# Patient Record
Sex: Female | Born: 1980 | Race: Black or African American | Hispanic: No | Marital: Single | Smoking: Never smoker
Health system: Southern US, Community
[De-identification: ages and names within clinical notes are randomized; demographics above are authoritative.]

## PROBLEM LIST (undated history)

## (undated) ENCOUNTER — Observation Stay: Admission: AD | Payer: Medicaid Other | Source: Ambulatory Visit | Admitting: Obstetrics & Gynecology

## (undated) DIAGNOSIS — E119 Type 2 diabetes mellitus without complications: Secondary | ICD-10-CM

## (undated) DIAGNOSIS — I10 Essential (primary) hypertension: Secondary | ICD-10-CM

## (undated) DIAGNOSIS — O139 Gestational [pregnancy-induced] hypertension without significant proteinuria, unspecified trimester: Secondary | ICD-10-CM

---

## 2004-09-27 ENCOUNTER — Emergency Department: Admit: 2004-09-27 | Payer: Self-pay | Source: Emergency Department | Admitting: Emergency Medicine

## 2016-12-20 ENCOUNTER — Emergency Department: Payer: Self-pay

## 2016-12-20 ENCOUNTER — Emergency Department: Payer: Worker's Comp, Other unspecified

## 2016-12-20 ENCOUNTER — Emergency Department
Admission: EM | Admit: 2016-12-20 | Discharge: 2016-12-20 | Disposition: A | Discharge status: Home / Self Care | Payer: Worker's Comp, Other unspecified | Attending: Emergency Medicine | Admitting: Emergency Medicine

## 2016-12-20 DIAGNOSIS — M25562 Pain in left knee: Secondary | ICD-10-CM | POA: Insufficient documentation

## 2016-12-20 DIAGNOSIS — Y92239 Unspecified place in hospital as the place of occurrence of the external cause: Secondary | ICD-10-CM | POA: Insufficient documentation

## 2016-12-20 DIAGNOSIS — X58XXXA Exposure to other specified factors, initial encounter: Secondary | ICD-10-CM | POA: Insufficient documentation

## 2016-12-20 DIAGNOSIS — Y990 Civilian activity done for income or pay: Secondary | ICD-10-CM | POA: Insufficient documentation

## 2016-12-20 NOTE — ED Provider Notes (Signed)
Physician/Midlevel provider first contact with patient: 12/20/16 1754         History     Chief Complaint   Patient presents with   . Back Pain   . Knee Pain   . 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 Patient was ambulatory after the event, but has ongoing knee and back pain, and is here for evaluation. Patient denies headache, dizziness, weakness, numbness/tingling, nausea/vomiting, neck pain, abdominal pain, shortness of breath.                 History reviewed. No pertinent past medical history.    History reviewed. No pertinent surgical history.    History reviewed. No pertinent family history.    Social  Social History   Substance Use Topics   . Smoking status: Never Smoker   . Smokeless tobacco: Never Used   . Alcohol use Yes      Comment: Socially       .     No Known Allergies    Home Medications     No Medications           Review of Systems   Constitutional: Negative.    HENT: Negative.    Eyes: Negative.    Respiratory: Negative.    Cardiovascular: Negative.    Gastrointestinal: Negative.    Genitourinary: Negative.    Musculoskeletal: Negative for joint swelling.        Left knee pain with movement   Skin: Negative.    Neurological: Negative.    Hematological: Negative.    Psychiatric/Behavioral: Negative.        Physical Exam    BP: 142/87, Heart Rate: 81, Temp: 98 F (36.7 C), Resp Rate: 18, SpO2: 100 %, Weight: 86.2 kg    Physical Exam   Constitutional: She is oriented to person, place, and time. She appears well-developed and well-nourished. No distress.   HENT:   Head: Normocephalic and atraumatic.   Right Ear: External ear normal.   Left Ear: External ear normal.   Nose: Nose normal.   Eyes: Conjunctivae and EOM are normal. Pupils are equal, round, and reactive to light. Right eye exhibits no discharge. Left eye exhibits no discharge. No scleral  icterus.   Neck: Normal range of motion. Neck supple. No JVD present. No tracheal deviation present.   Cardiovascular: Normal rate, regular rhythm, normal heart sounds and intact distal pulses.  Exam reveals no gallop and no friction rub.    No murmur heard.  Pulmonary/Chest: Effort normal and breath sounds normal. No stridor. No respiratory distress. She has no wheezes. She has no rales. She exhibits no tenderness.   Abdominal: Soft. Bowel sounds are normal. She exhibits no distension. There is no tenderness. There is no rebound and no guarding.   Musculoskeletal: She exhibits tenderness.   Left knee, tender to palpation, pain with movement   Neurological: She is alert and oriented to person, place, and time. She displays normal reflexes. No cranial nerve deficit or sensory deficit. She exhibits normal muscle tone. Coordination normal.   Skin: Skin is warm and dry. Capillary refill takes less than 2 seconds. No rash noted. She is not diaphoretic. No erythema. No pallor.   Psychiatric: She has a normal mood and affect. Her behavior is normal. Judgment and thought content  normal.   Nursing note and vitals reviewed.        MDM and ED Course     ED Medication Orders     None             MDM  Number of Diagnoses or Management Options  Diagnosis management comments: Left knee pain       Amount and/or Complexity of Data Reviewed  Tests in the radiology section of CPT: ordered  Discuss the patient with other providers: yes           Knee 4+ Views Left    Result Date: 12/20/2016  There is no evidence of acute injury. Stephannie Peters, MD 12/20/2016 7:06 PM    Discharge with ace wrap, ice and elevation, tylenol or ibuprofen as needed; follow up with Orthopedics; retun to work 3 days     Procedures    Clinical Impression & Disposition     Clinical Impression  Final diagnoses:   Acute pain of left knee        ED Disposition     ED Disposition Condition Date/Time Comment    Discharge  Tue Dec 20, 2016  7:23 PM Arna Snipe  discharge to home/self care.    Condition at disposition: Stable           New Prescriptions    No medications on file        Treatment Team: Scribe: Tresa Endo, [place]  12/20/16 1925

## 2016-12-20 NOTE — Discharge Instructions (Signed)
Dear Ms. Kristina Avila:    Thank you for choosing the [facility] Emergency Department, the premier emergency department in the [place] area.  I hope your visit today was EXCELLENT.    Specific instructions for your visit today:    Tylenol or ibuprofen as needed  Orthopedic follow up with [facility]    If you do not continue to improve or your condition worsens, please contact your doctor or return immediately to the Emergency Department.    Sincerely,  Mikel Cella*  Attending Emergency Physician  [facility] Emergency Department    ONSITE PHARMACY  Our full service onsite pharmacy is located in the ER waiting room.  Open 7 days a week from 9 am to 11 pm.  We accept all major insurances and prices are competitive with major retailers.  Ask your provider to print your prescriptions down to the pharmacy to speed you on your way home.    OBTAINING A PRIMARY CARE APPOINTMENT    Primary care physicians (PCPs, also known as primary care doctors) are either internists or family medicine doctors. Both types of PCPs focus on health promotion, disease prevention, patient education and counseling, and treatment of acute and chronic medical conditions.    Call for an appointment with a primary care doctor.  Ask to see who is taking new patients.      Medical Group  telephone:  [phone]  [email or web address]    DOCTOR REFERRALS  Call [phone] (available 24 hours a day, 7 days a week) if you need any further referrals and we can help you find a primary care doctor or specialist.  Also, available online at:  [email or web address]    YOUR CONTACT INFORMATION  Before leaving please check with registration to make sure we have an up-to-date contact number.  You can call registration at [phone] to update your information.  For questions about your hospital bill, please call [phone].  For questions about your Emergency Dept Physician bill please call  [phone].      FREE HEALTH SERVICES  If you need help with health or social services, please call 2-1-1 for a free referral to resources in your area.  2-1-1 is a free service connecting people with information on health insurance, free clinics, pregnancy, mental health, dental care, food assistance, housing, and substance abuse counseling.  Also, available online at:  http://www.[place].org    MEDICAL RECORDS AND TESTS  Certain laboratory test results do not come back the same day, for example urine cultures.   We will contact you if other important findings are noted.  Radiology films are often reviewed again to ensure accuracy.  If there is any discrepancy, we will notify you.      Please call [phone] to pick up a complimentary CD of any radiology studies performed.  If you or your doctor would like to request a copy of your medical records, please call [phone].      ORTHOPEDIC INJURY   Please know that significant injuries can exist even when an initial x-ray is read as normal or negative.  This can occur because some fractures (broken bones) are not initially visible on x-rays.  For this reason, close outpatient follow-up with your primary care doctor or bone specialist (orthopedist) is required.    MEDICATIONS AND FOLLOWUP  Please be aware that some prescription medications can cause drowsiness.  Use caution when driving or operating machinery.    The examination and treatment you have received in our Emergency Department  is provided on an emergency basis, and is not intended to be a substitute for your primary care physician.  It is important that your doctor checks you again and that you report any new or remaining problems at that time.      [place]  The nearest 24 hour pharmacy is:    [facility] [address] [place], [place] [address]  [place]  [facility])  Call to start or finish an application,  compare plans, enroll or ask a question.  [place]: [phone]  Web:  Healthcare.gov    Help Enrolling in [place]  267-521-0446 (TOLL-FREE)  (443)536-8139 (TTY)  Web:  Http://www.coverva.org    Local Help Enrolling in the [place]  825-553-2961 (MAIN)  Email:  health-help@nvfs$ .org  Web:  http://lewis-perez.info/  Address:  [id number] [place], [place] [address]    SEDATING MEDICATIONS  Sedating medications include strong pain medications (e.g. narcotics), muscle relaxers, benzodiazepines (used for anxiety and as muscle relaxers), Benadryl/diphenhydramine and other antihistamines for allergic reactions/itching, and other medications.  If you are unsure if you have received a sedating medication, please ask your physician or nurse.  If you received a sedating medication: DO NOT drive a car. DO NOT operate machinery. DO NOT perform jobs where you need to be alert.  DO NOT drink alcoholic beverages while taking this medicine.     If you get dizzy, sit or lie down at the first signs. Be careful going up and down stairs.  Be extra careful to prevent falls.     Never give this medicine to others.     Keep this medicine out of reach of children.     Do not take or save old medicines. Throw them away when outdated.     Keep all medicines in a cool, dry place. DO NOT keep them in your bathroom medicine cabinet or in a cabinet above the stove.    MEDICATION REFILLS  Please be aware that we cannot refill any prescriptions through the ER. If you need further treatment from what is provided at your ER visit, please follow up with your primary care doctor or your pain management specialist.    [place]  Did you know [facility] has two freestanding ERs located just a few miles away?  [facility] [place] [facility] [place] have short wait times, easy free parking directly in front of the  building and top patient satisfaction scores - and the same Board Certified Emergency Medicine doctors as [facility].

## 2016-12-20 NOTE — ED Provider Notes (Signed)
[facility] EMERGENCY DEPARTMENT H&P                                             ATTENDING SUPERVISORY NOTE       ATTENDING NOTE      Seen and agree with plan as documented by PA.    I spoke to and examined the patient as well: Yes  I was present during key portions of any procedures performed: N/A            VISIT INFORMATION        Clinical Course in the ED:             Medications Given in the ED:    .     ED Medication Orders     None            Procedures:            Interpretations:      O2 sat-           saturation: 100 %; Oxygen use: room air; Interpretation: Normal              PAST HISTORY        Primary Care Provider: No primary care provider on file.        PMH/PSH:    .     History reviewed. No pertinent past medical history.    She has no past surgical history on file.      Social/Family History:      She reports that she has never smoked. She has never used smokeless tobacco. She reports that she drinks alcohol. She reports that she does not use drugs.    History reviewed. No pertinent family history.      Listed Medications on Arrival:    .     Home Medications     No Medications         Allergies: She has No Known Allergies.            RESULTS        Lab Results:      Results     ** No results found for the last 24 hours. **              Radiology Results:      No orders to display               Attending Attestation:      The patient was seen and examined by the mid-level (physician's assistant or nurse practitioner), or fellow, and the plan of care was discussed with me. I agree with the plan as it was presented to me.  I have reviewed and agree with the final ED diagnosis.              Scribe Attestation:      I was acting as a Neurosurgeon for [employer]* on Haskel Schroeder    I am the first provider for this patient and I personally performed the services documented. Pablo Ledger is scribing for me on [facility] M. This note and  the patient instructions accurately reflect work and decisions made by me.  Clarisa Schools, MD  12/25/16 [phone]

## 2019-09-12 ENCOUNTER — Emergency Department
Admission: EM | Admit: 2019-09-12 | Discharge: 2019-09-12 | Discharge status: Against Medical Advice | Payer: Worker's Comp, Other unspecified | Attending: Emergency Medicine | Admitting: Emergency Medicine

## 2019-09-12 ENCOUNTER — Observation Stay
Admission: AD | Admit: 2019-09-12 | Discharge: 2019-09-12 | Disposition: A | Discharge status: Home / Self Care | Payer: Medicaid Other | Source: Ambulatory Visit | Attending: Obstetrics & Gynecology | Admitting: Obstetrics & Gynecology

## 2019-09-12 DIAGNOSIS — O24912 Unspecified diabetes mellitus in pregnancy, second trimester: Secondary | ICD-10-CM | POA: Insufficient documentation

## 2019-09-12 DIAGNOSIS — R1030 Lower abdominal pain, unspecified: Secondary | ICD-10-CM | POA: Insufficient documentation

## 2019-09-12 DIAGNOSIS — Z3490 Encounter for supervision of normal pregnancy, unspecified, unspecified trimester: Secondary | ICD-10-CM

## 2019-09-12 DIAGNOSIS — Z3A20 20 weeks gestation of pregnancy: Secondary | ICD-10-CM | POA: Insufficient documentation

## 2019-09-12 DIAGNOSIS — Z538 Procedure and treatment not carried out for other reasons: Secondary | ICD-10-CM | POA: Insufficient documentation

## 2019-09-12 DIAGNOSIS — Z7982 Long term (current) use of aspirin: Secondary | ICD-10-CM | POA: Insufficient documentation

## 2019-09-12 DIAGNOSIS — O26892 Other specified pregnancy related conditions, second trimester: Principal | ICD-10-CM | POA: Insufficient documentation

## 2019-09-12 DIAGNOSIS — O132 Gestational [pregnancy-induced] hypertension without significant proteinuria, second trimester: Secondary | ICD-10-CM | POA: Insufficient documentation

## 2019-09-12 DIAGNOSIS — O09522 Supervision of elderly multigravida, second trimester: Secondary | ICD-10-CM | POA: Insufficient documentation

## 2019-09-12 HISTORY — DX: Type 2 diabetes mellitus without complications: E11.9

## 2019-09-12 HISTORY — DX: Essential (primary) hypertension: I10

## 2019-09-12 HISTORY — DX: Gestational (pregnancy-induced) hypertension without significant proteinuria, unspecified trimester: O13.9

## 2019-09-12 LAB — URINALYSIS REFLEX TO MICROSCOPIC EXAM - REFLEX TO CULTURE
Bilirubin, UA: NEGATIVE
Blood, UA: NEGATIVE
Glucose, UA: NEGATIVE
Nitrite, UA: NEGATIVE
Protein, UR: 30 — AB
Specific Gravity UA: 1.027 (ref 1.001–1.035)
Urine pH: 6 (ref 5.0–8.0)
Urobilinogen, UA: NEGATIVE mg/dL (ref 0.2–2.0)

## 2019-09-12 MED ORDER — NITROFURANTOIN MONOHYD MACRO 100 MG PO CAPS
100.0000 mg | ORAL_CAPSULE | Freq: Two times a day (BID) | ORAL | 0 refills | Status: AC
Start: 2019-09-12 — End: ?

## 2019-09-12 NOTE — Discharge Instr - AVS First Page (Addendum)
Reason for your Hospital Admission:  You were seen today for abdominal pain.  We assessed fetal heart rate at 125bpm, and monitored uterine activity with a tocodynamometer.  There was not uterine activity.  We sent a urinalysis and culture.  Dr. Eugenie Filler, the attending ob, spoke with you about options for care.  You chose to receive a prescription for macrobid, an antibiotic, to treat urinary tract infection.      Instructions for after your discharge:  Follow up with your regular provider.  Have someone pick up the prescription for you this afternoon and begin taking it tonight.   You will take one tablet twice a day until the prescription is finished or you are instructed by a MD to stop it.  Today you will only take one and the last day you take them, if you take the full course, you will only take one in the morning.  Stay well-hydrated, drink 4-5 pitchers of water daily.  I've provided you with a pitcher you may use    Patient was evaluated in labor and delivery today, and sent home. She can join work from [employer].

## 2019-09-12 NOTE — Progress Notes (Signed)
This note also relates to the following rows which could not be included:  Heart Rate - Cannot attach notes to unvalidated device data  BP - Cannot attach notes to unvalidated device data  MAP (mmHg) - Cannot attach notes to unvalidated device data    Confirmed fetal heart rate with doppler and palpating maternal left radial pulse

## 2019-09-12 NOTE — Discharge Instructions (Signed)
Urinary Tract Infections in Women    Urinary tract infections (UTIs) are most often caused bybacteria. These bacteria enter the urinary tract. The bacteria may come from inside the body. Or they may travel from the skin outside therectum or vagina into the urethra. Female anatomy makes it easy for bacteria from the bowelto enter a woman's urinary tract, which is the most common source of UTI. This means women develop UTIs more often than men. Pain in or around the urinary tract is a common UTI symptom. But the only way to know for sure if you have a UTI for the healthcare provider to test your urine. The two tests that may be done are the urinalysis and urine culture.   Types of UTIs   Cystitis. A bladder infection (cystitis) is the most common UTI in women. You may have urgent or frequent need to pee. You may also havepain, burning when you pee, and bloody urine.   Urethritis. This is an inflamed urethra, which is the tube that carries urine from the bladder to outside the body. You may have lower stomach or back pain. You may also have urgent or frequent need to pee.   Pyelonephritis. This is a kidney infection. If not treated, it can be serious and damage your kidneys. In severe cases, you may need to stay in thehospital. You may have a fever and lower back pain.    Medicines to treat a UTI  Most UTIs are treated with antibiotics. These kill the bacteria. The length of time you need to take them depends on the type of infection. It may be as short as 3 days. If you have repeated UTIs, you may need a low-dose antibioticfor several months. Take antibiotics exactly as directed. Don't stop taking them until all of the medicine is gone. If you stop taking the antibiotic too soon, the infection may not go away. You may also develop a resistance to the antibiotic. This can make it much harder to treat.   Lifestyle changes to treat and prevent UTIs   The lifestyle changes below will help get rid of your UTI.  They may also help prevent future UTIs.    Drink plenty of fluids. This includes water, juice, or other caffeine-free drinks. Fluids help flush bacteria out of your body.   Empty your bladder. Always empty your bladder when you feel the urge to pee. And always pee before going to sleep. Urine that stays in your bladder can lead to infection. Try to pee before and after sex as well.   Practice good personal hygiene. Wipe yourself from front to back after using the toilet. This helps keep bacteria from getting into the urethra.   Use condoms during sex. These help prevent UTIs caused by sexually transmitted bacteria. Also don't use spermicides during sex. These can increase the risk for UTIs. Choose other forms of birth control instead. For women who tend to get UTIs after sex, a low-dose of a preventive antibiotic may be used. Be sure to discuss this option with your healthcare provider.   Follow up with your healthcare provider as directed. He or she may test to make sure the infection has cleared. If needed, more treatment may be started.  [employer]Well last reviewed this educational content on 02/12/2018   2000-2020 [employer] [address], [place] [address]. All rights reserved. This information is not intended as a substitute for professional medical care. Always follow your healthcare professional's instructions.

## 2019-09-12 NOTE — Progress Notes (Signed)
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  Patient reports EDC of 01/24/20 and that she will deliver in [facility], [place], where she lives, gets her prenatal care, and has delivered her other three children.  She reported last eating at 0545 and having water at the same time.  She denies leaking of fluid or blood and reports she feels fetal activity.     Fetal heart tones were assessed low midline at 125bpm.  Tocodynamometer was applied.  Patient was provided with a pitcher of water and a sandwich from nutrition room.    A urinalysis was obtained.

## 2019-09-12 NOTE — ED Triage Notes (Signed)
Pt went to L&D. I had no contact with this patient.

## 2019-09-12 NOTE — H&P (Signed)
OBSTETRICS CLINICAL DECISION UNIT HISTORY AND PHYSICAL EXAM  [facility]  Antonieta Pert and [place]  09/12/2019 1:59 PM    Chief Complaint:   Abdominal Pain (and cramping started this morning at about 1030 hours)  39 yo, G4P3, IUP at 20+6 weeks  Came in with lower abdominal pain  No urinary symptoms  FM well felt, no VB or contractions or leaking  Ob care at [facility]  Concerns in pregnancy: AMA, has hx of HTN, but not on meds  patient works at [employer]  Her anatomy USS was normal recently    History of Presenting Illness:   EVALISSE FENDER is a 39 y.o. G4P3003 at [redacted]w[redacted]d weeks gestation with Estimated Date of Delivery: 01/24/20 who presents to labor and delivery for lower abdominal pain. The patient has no additional complaints. The patient reports that she has had her [facility] with the [facility] and that it has been benign. Her records, through the most recent date available, have been located here and have been reviewed by myself.    Past Gynecologic History:   None. No STDs, no PID, No abnormal pap smears.    Past Obstetric History:     OB History   Gravida Para Term Preterm AB Living   4 3 3     3    SAB TAB Ectopic Multiple Live Births           3      # Outcome Date GA Lbr Len/2nd Weight Sex Delivery Anes PTL Lv   4 Current            3 Term 08/26/09 [redacted]w[redacted]d   M Vag-Spont None N LIV   2 Term 04/30/08 [redacted]w[redacted]d   M Vag-Spont None N LIV   1 Term 07/19/03 [redacted]w[redacted]d   F Vag-Spont EPI N LIV       Past Medical History:     Past Medical History:   Diagnosis Date    Diabetes mellitus     2nd pregnancy    Gestational hypertension     Hypertension        Past Surgical History:   History reviewed. No pertinent surgical history.    Family History:     Family History   Problem Relation Age of Onset    Hypertension Mother        Social History:    reports that she has never smoked. She has never used smokeless tobacco. She reports previous alcohol use. She reports that she does not use drugs.  Medications:       Medications Prior to Admission   Medication Sig Dispense Refill Last Dose    acetaminophen (TYLENOL) 325 MG tablet Take 325 mg by mouth   Past Week at Unknown time    aspirin 81 MG chewable tablet Chew 81 mg by mouth daily   09/11/2019 at Unknown time    calcium carbonate (TUMS) 500 MG chewable tablet Chew 1 tablet by mouth daily   Past Week at Unknown time    Prenatal Vit-Fe Fumarate-FA (PRENATAL VITAMINS PO) Take by mouth   09/11/2019 at Unknown time       Allergies:   No Known Allergies    Review of Systems:   A complete review of systems was obtained from the patient.  For the following systems the patient denied any symptomatology: reproductive, constitutional symptoms such as fever or weight loss, ears, nose, mouth, throat, cardiovascular, respiratory, gastrointestinal, genitourinary, musculoskeletal, integumentary, neurological, psychiatric, endocrine and hematologic/lymphatic  For the following systems the patient reported symptoms: none    Physical Exam:     Temp:  [98.9 F (37.2 C)] 98.9 F (37.2 C)  Heart Rate:  [81] 81  Resp Rate:  [18] 18  BP: (124)/(77) 124/77    Body mass index is 37.38 kg/m.  EFM: 125 by doppler.  UCs: none  Cx: not indicated at present; .    General: appears comfortable. No apparent distress.  Neurological: oriented to time, place and location.  Heart: RRR w/o M/G/R. Normal pedal pulses  Lungs: CTAB w/o W/R/R and w/ good air entry throughout. Normal respiratory effort  Back: atraumatic, no CVAT, no point tenderness  Abd: gravid, NT with appropriate FH. No palpable masses.  Extremities: no cyanosis or erythema, no LE edema edema, calves pain-free, symmetric and nontender  Skin: intact, no traumatic injury (abrasion, laceration or contusion), exanthem or excoriation  Pelvic Exam:deferred  Urological: urethral meatus with normal appearance and urethra normal to palpation    Prenatal Care Labs:   No results found for: ABORH, LABANTI, HEPBSAG, GBS, RPR, RUBELLAABIGG, HIVAB,  CTRACHOMATCX, CULTUREGONOR    Recent Labs:     Results     Procedure Component Value Units Date/Time    ADULT Urinalysis Reflex to Microscopic Exam - Reflex to Culture [[id number]]  (Abnormal) Collected: 09/12/19 1326     Updated: 09/12/19 1349     Urine Type Urine, Clean Ca     Color, UA Yellow     Clarity, UA Hazy     Specific Gravity UA 1.027     Urine pH 6.0     Leukocyte Esterase, UA Trace     Nitrite, UA Negative     Protein, UR 30     Glucose, UA Negative     Ketones UA Trace     Urobilinogen, UA Negative mg/dL      Bilirubin, UA Negative     Blood, UA Negative     RBC, UA 0 - 2 /hpf      WBC, UA 0 - 5 /hpf      Squamous Epithelial Cells, Urine 0 - 5 /hpf      Urine Mucus Present    Narrative:      Replace urinary catheter prior to obtaining the urine culture  if it has been in place for greater than or equal to 14  days:->N/A No Foley  Indications for U/A Reflex to Micro - Reflex to  Culture:->Neutropenia, Pregnancy, or Undergoing Urologic  Procedure          Radiology:     Radiology Results (24 Hour)     ** No results found for the last 24 hours. **           Assessment and Plan:   1. IUP at [redacted]w[redacted]d gestation. Here to evaluate lower abdominal pain  2. No evidence of acute maternal or fetal compromise  3. FHTs detected by Doppler.  4. Followup for prenatal care as explained to you at the time of discharge by myself.  5. Routine followup, instructions, and precautions (as given by myself and reinforced by RN at discharge)   6. Based on UA, gave her the option of waiting for culture result and then deciding for abx vs starting abx now for potential UTI and she will follow up with culture report on 2-3 days. She opted for the latter. Will start macrobid  7. Urine culture sent  8. Work note given    Marla Roe, MD

## 2019-09-12 NOTE — Discharge Summary -  Nursing (Signed)
Patient discharged to home stable and ambulatory.  She verbalizes understanding of discharge instructions.

## 2020-04-17 ENCOUNTER — Encounter (HOSPITAL_BASED_OUTPATIENT_CLINIC_OR_DEPARTMENT_OTHER): Payer: Self-pay
